# Patient Record
Sex: Male | Born: 1975 | Race: Black or African American | Hispanic: No | Marital: Single | Smoking: Current every day smoker
Health system: Southern US, Community
[De-identification: ages and names within clinical notes are randomized; demographics above are authoritative.]

## PROBLEM LIST (undated history)

## (undated) HISTORY — PX: ANKLE SURGERY: SHX546

---

## 2009-03-14 ENCOUNTER — Emergency Department (HOSPITAL_COMMUNITY): Admission: EM | Admit: 2009-03-14 | Discharge: 2009-03-14 | Payer: Self-pay | Admitting: Emergency Medicine

## 2010-07-14 ENCOUNTER — Emergency Department (HOSPITAL_COMMUNITY)
Admission: EM | Admit: 2010-07-14 | Discharge: 2010-07-14 | Disposition: A | Discharge status: Home / Self Care | Payer: Self-pay | Attending: Emergency Medicine | Admitting: Emergency Medicine

## 2010-07-14 DIAGNOSIS — K089 Disorder of teeth and supporting structures, unspecified: Secondary | ICD-10-CM | POA: Insufficient documentation

## 2010-07-14 DIAGNOSIS — R221 Localized swelling, mass and lump, neck: Secondary | ICD-10-CM | POA: Insufficient documentation

## 2010-07-14 DIAGNOSIS — R599 Enlarged lymph nodes, unspecified: Secondary | ICD-10-CM | POA: Insufficient documentation

## 2010-07-14 DIAGNOSIS — R220 Localized swelling, mass and lump, head: Secondary | ICD-10-CM | POA: Insufficient documentation

## 2010-07-14 DIAGNOSIS — K006 Disturbances in tooth eruption: Secondary | ICD-10-CM | POA: Insufficient documentation

## 2015-12-18 ENCOUNTER — Emergency Department (HOSPITAL_BASED_OUTPATIENT_CLINIC_OR_DEPARTMENT_OTHER)
Admission: EM | Admit: 2015-12-18 | Discharge: 2015-12-19 | Disposition: A | Discharge status: Home / Self Care | Payer: Self-pay | Attending: Emergency Medicine | Admitting: Emergency Medicine

## 2015-12-18 ENCOUNTER — Encounter (HOSPITAL_BASED_OUTPATIENT_CLINIC_OR_DEPARTMENT_OTHER): Payer: Self-pay | Admitting: *Deleted

## 2015-12-18 DIAGNOSIS — F17200 Nicotine dependence, unspecified, uncomplicated: Secondary | ICD-10-CM | POA: Insufficient documentation

## 2015-12-18 DIAGNOSIS — T63301A Toxic effect of unspecified spider venom, accidental (unintentional), initial encounter: Secondary | ICD-10-CM

## 2015-12-18 DIAGNOSIS — T63331A Toxic effect of venom of brown recluse spider, accidental (unintentional), initial encounter: Secondary | ICD-10-CM | POA: Insufficient documentation

## 2015-12-18 DIAGNOSIS — Z23 Encounter for immunization: Secondary | ICD-10-CM | POA: Insufficient documentation

## 2015-12-18 NOTE — ED Triage Notes (Signed)
Pt states he thumped a ?spider off his hand 3-4 days ago. The next day noticed blister which he popped. Area ha blackened center with blisters around circumference.

## 2015-12-19 MED ORDER — MUPIROCIN CALCIUM 2 % EX CREA
TOPICAL_CREAM | Freq: Once | CUTANEOUS | Status: DC
  Filled 2015-12-19: qty 15

## 2015-12-19 MED ORDER — TETANUS-DIPHTH-ACELL PERTUSSIS 5-2.5-18.5 LF-MCG/0.5 IM SUSP
0.5000 mL | Freq: Once | INTRAMUSCULAR | Status: AC
  Administered 2015-12-19: 0.5 mL via INTRAMUSCULAR
  Filled 2015-12-19: qty 0.5

## 2015-12-19 MED ORDER — MUPIROCIN 2 % EX OINT
TOPICAL_OINTMENT | CUTANEOUS | Status: AC
  Administered 2015-12-19: 01:00:00
  Filled 2015-12-19: qty 22

## 2015-12-19 NOTE — ED Notes (Signed)
Patient has a left palm black area that was where he killed a spider the other day. Area cleaned and dressed per the NP order. Patient in No distress. D/c'd home

## 2015-12-19 NOTE — Discharge Instructions (Signed)
Use the antibiotic ointment on the area twice a day, clean the wound with antibacterial soap and warm water. Return as needed for worsening symptoms.

## 2015-12-19 NOTE — ED Provider Notes (Signed)
[facility]   CSN: [id number] Arrival date & time: 12/18/15  2227     History   Chief Complaint Chief Complaint  Patient presents with  . Insect Bite    HPI Maryan PulsJamie Riojas is a 40 y.o. male who presents to the ED with an area to the palm of the left hand that started 4 days ago after he flipped a small brown spider off his hand. He did not think the spider bit him until he noted a blister area on his hand and then it turned black in the center and had small blisters around it. He denies fever, n/v or other problems.   The history is provided by the patient. No language interpreter was used.    History reviewed. No pertinent past medical history.  There are no active problems to display for this patient.   Past Surgical History:  Procedure Laterality Date  . ANKLE SURGERY         Home Medications    Prior to Admission medications   Not on File    Family History History reviewed. No pertinent family history.  Social History Social History  Substance Use Topics  . Smoking status: Current Every Day Smoker  . Smokeless tobacco: Never Used  . Alcohol use Yes     Allergies   Review of patient's allergies indicates no known allergies.   Review of Systems Review of Systems Negative except as stated in HPI  Physical Exam Updated Vital Signs BP 140/76 (BP Location: Right Arm)   Pulse 62   Temp 97.7 F (36.5 C) (Oral)   Resp 18   Ht 5\' 10"  (1.778 m)   Wt 61.2 kg   SpO2 100%   BMI 19.37 kg/m   Physical Exam  Constitutional: He is oriented to person, place, and time. He appears well-developed and well-nourished. No distress.  HENT:  Head: Normocephalic.  Eyes: EOM are normal.  Neck: Neck supple.  Cardiovascular: Normal rate.   Pulmonary/Chest: Effort normal.  Musculoskeletal: Normal range of motion.       Left hand: He exhibits tenderness (mild). Normal sensation noted. Normal strength noted.       Hands: There is an area  that is black surrounded by tiny vesicular areas. No red streaking. Radial pulse 2+.   Neurological: He is alert and oriented to person, place, and time. No cranial nerve deficit.  Skin: Skin is warm and dry. Capillary refill takes less than 2 seconds.  Nursing note and vitals reviewed.    ED Treatments / Results  Labs (all labs ordered are listed, but only abnormal results are displayed) Labs Reviewed - No data to display Radiology No results found.  Procedures Procedures (including critical care time)  Medications Ordered in ED Medications  mupirocin cream (BACTROBAN) 2 % (not administered)  Tdap (BOOSTRIX) injection 0.5 mL (not administered)     Initial Impression / Assessment and Plan / ED Course  I have reviewed the triage vital signs and the nursing notes.  Pertinent labs & imaging results that were available during my care of the patient were reviewed by me and considered in my medical decision making (see chart for details).  Clinical Course   Tetanus updated, Bactroban   Final Clinical Impressions(s) / ED Diagnoses  40 y.o. male with spider bit to the left hand stable for d/c without fever, red streaking or other signs of infection. Discussed with the patient in detail signs and symptoms that would indicate need to  return. All questioned fully answered.   Final diagnoses:  Spider bite, accidental or unintentional, initial encounter    New Prescriptions New Prescriptions   No medications on file     [facility] Darien Mignogna, NP 12/19/15 0028    April Palumbo, MD 12/19/15 [phone]

## 2015-12-24 ENCOUNTER — Encounter (HOSPITAL_BASED_OUTPATIENT_CLINIC_OR_DEPARTMENT_OTHER): Payer: Self-pay | Admitting: *Deleted

## 2015-12-24 DIAGNOSIS — Y638 Failure in dosage during other surgical and medical care: Secondary | ICD-10-CM | POA: Insufficient documentation

## 2015-12-24 DIAGNOSIS — F17200 Nicotine dependence, unspecified, uncomplicated: Secondary | ICD-10-CM | POA: Insufficient documentation

## 2015-12-24 DIAGNOSIS — T50Z91A Poisoning by other vaccines and biological substances, accidental (unintentional), initial encounter: Secondary | ICD-10-CM | POA: Insufficient documentation

## 2015-12-24 DIAGNOSIS — F1290 Cannabis use, unspecified, uncomplicated: Secondary | ICD-10-CM | POA: Insufficient documentation

## 2015-12-24 NOTE — ED Triage Notes (Signed)
He was here for a bug bite to the palm of his left hand a week ago. Now he is here for a rash since the visit.

## 2015-12-25 ENCOUNTER — Emergency Department (HOSPITAL_BASED_OUTPATIENT_CLINIC_OR_DEPARTMENT_OTHER)
Admission: EM | Admit: 2015-12-25 | Discharge: 2015-12-25 | Disposition: A | Discharge status: Home / Self Care | Payer: Self-pay | Attending: Emergency Medicine | Admitting: Emergency Medicine

## 2015-12-25 DIAGNOSIS — L270 Generalized skin eruption due to drugs and medicaments taken internally: Secondary | ICD-10-CM

## 2015-12-25 MED ORDER — PREDNISONE 10 MG PO TABS: 40.0000 mg | ORAL_TABLET | Freq: Every day | ORAL | 0 refills | Status: DC

## 2015-12-25 NOTE — Discharge Instructions (Signed)
You can take benadryl, available over the counter, according to label instruction as needed for itching.

## 2015-12-25 NOTE — ED Provider Notes (Signed)
[facility]   CSN: [id number] Arrival date & time: 12/24/15  2025     History   Chief Complaint Chief Complaint  Patient presents with  . Rash    HPI John Stanley is a 40 y.o. male.  The history is provided by the patient. No language interpreter was used.  Rash      John Stanley is a 40 y.o. male who presents to the Emergency Department complaining of rash.  He was seen for a spider bite to the left hand one week ago. When he was in the emergency department for the bite he was given a tetanus shot. After the shot he developed a diffuse itchy rash 3 hours later. He continues to have an itchy rash to his head, neck, trunk, all 4 extremities. He has tried Benadryl cream without any improvement in symptoms. He denies any fevers, cough, sore throat, shortness of breath. No prior similar symptoms. Sxs mild to moderate, constant.  No exposure to new soaps, creams, foods.    History reviewed. No pertinent past medical history.  There are no active problems to display for this patient.   Past Surgical History:  Procedure Laterality Date  . ANKLE SURGERY         Home Medications    Prior to Admission medications   Not on File    Family History No family history on file.  Social History Social History  Substance Use Topics  . Smoking status: Current Every Day Smoker  . Smokeless tobacco: Never Used  . Alcohol use Yes     Allergies   Review of patient's allergies indicates no known allergies.   Review of Systems Review of Systems  Skin: Positive for rash.  All other systems reviewed and are negative.    Physical Exam Updated Vital Signs BP 135/86 (BP Location: Right Arm)   Pulse (!) 59   Temp 98.5 F (36.9 C) (Oral)   Resp 20   Ht 5\' 10"  (1.778 m)   Wt 135 lb (61.2 kg)   SpO2 100%   BMI 19.37 kg/m   Physical Exam  Constitutional: He is oriented to person, place, and time. He appears well-developed and well-nourished.  HENT:    Head: Normocephalic and atraumatic.  Mouth/Throat: Oropharynx is clear and moist.  Cardiovascular: Normal rate and regular rhythm.   No murmur heard. Pulmonary/Chest: Effort normal and breath sounds normal. No respiratory distress.  Musculoskeletal: He exhibits no edema or tenderness.  Neurological: He is alert and oriented to person, place, and time.  Skin: Skin is warm and dry.  Multiple fine macules and vesicles to face, neck, trunk, arms. No petechiae or purpura. No drainage, cellulitis.  Psychiatric: He has a normal mood and affect. His behavior is normal.  Nursing note and vitals reviewed.    ED Treatments / Results  Labs (all labs ordered are listed, but only abnormal results are displayed) Labs Reviewed - No data to display  EKG  EKG Interpretation None       Radiology No results found.  Procedures Procedures (including critical care time)  Medications Ordered in ED Medications - No data to display   Initial Impression / Assessment and Plan / ED Course  I have reviewed the triage vital signs and the nursing notes.  Pertinent labs & imaging results that were available during my care of the patient were reviewed by me and considered in my medical decision making (see chart for details).  Clinical Course  Patient here for evaluation of diffuse rash that occurred 3-4 hours after Tdap administration. Rash is macular and vesicular in nature. He has no symptoms of varicella or zoster. Will treat for drug rash with steroids. Discussed with outpatient follow-up and return precautions.  Final Clinical Impressions(s) / ED Diagnoses   Final diagnoses:  Drug rash    New Prescriptions New Prescriptions   No medications on file     Tilden Fossa, MD 12/25/15 1610

## 2017-01-17 ENCOUNTER — Encounter (HOSPITAL_BASED_OUTPATIENT_CLINIC_OR_DEPARTMENT_OTHER): Payer: Self-pay

## 2017-01-17 DIAGNOSIS — F17210 Nicotine dependence, cigarettes, uncomplicated: Secondary | ICD-10-CM | POA: Insufficient documentation

## 2017-01-17 DIAGNOSIS — Y939 Activity, unspecified: Secondary | ICD-10-CM | POA: Insufficient documentation

## 2017-01-17 DIAGNOSIS — Y999 Unspecified external cause status: Secondary | ICD-10-CM | POA: Diagnosis not present

## 2017-01-17 DIAGNOSIS — M549 Dorsalgia, unspecified: Secondary | ICD-10-CM | POA: Insufficient documentation
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
## 2017-01-18 ENCOUNTER — Emergency Department (HOSPITAL_BASED_OUTPATIENT_CLINIC_OR_DEPARTMENT_OTHER): Payer: No Typology Code available for payment source

## 2017-01-18 ENCOUNTER — Emergency Department (HOSPITAL_BASED_OUTPATIENT_CLINIC_OR_DEPARTMENT_OTHER)
Admission: EM | Admit: 2017-01-18 | Discharge: 2017-01-18 | Disposition: A | Discharge status: Home / Self Care | Payer: No Typology Code available for payment source | Attending: Emergency Medicine | Admitting: Emergency Medicine

## 2017-01-18 MED ORDER — METHOCARBAMOL 500 MG PO TABS: 500.0000 mg | ORAL_TABLET | Freq: Two times a day (BID) | ORAL | 0 refills | Status: AC

## 2017-01-18 MED ORDER — ACETAMINOPHEN 500 MG PO TABS
1000.0000 mg | ORAL_TABLET | Freq: Once | ORAL | Status: AC
  Administered 2017-01-18: 1000 mg via ORAL
  Filled 2017-01-18: qty 2

## 2017-01-18 MED ORDER — IBUPROFEN 800 MG PO TABS
800.0000 mg | ORAL_TABLET | Freq: Once | ORAL | Status: AC
  Administered 2017-01-18: 800 mg via ORAL
  Filled 2017-01-18: qty 1

## 2017-01-18 MED ORDER — NAPROXEN 375 MG PO TABS: 375.0000 mg | ORAL_TABLET | Freq: Two times a day (BID) | ORAL | 0 refills | Status: AC

## 2017-01-18 MED ORDER — METHOCARBAMOL 500 MG PO TABS
1000.0000 mg | ORAL_TABLET | Freq: Once | ORAL | Status: AC
  Administered 2017-01-18: 1000 mg via ORAL
  Filled 2017-01-18: qty 2
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
